# Patient Record
Sex: Male | Born: 2000 | Race: Black or African American | Hispanic: No | Marital: Single | Smoking: Never smoker
Health system: Southern US, Community
[De-identification: ages and names within clinical notes are randomized; demographics above are authoritative.]

---
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
## 2020-08-08 ENCOUNTER — Emergency Department (HOSPITAL_COMMUNITY): Payer: BC Managed Care – PPO

## 2020-08-08 ENCOUNTER — Emergency Department (HOSPITAL_COMMUNITY)
Admission: EM | Admit: 2020-08-08 | Discharge: 2020-08-08 | Disposition: A | Discharge status: Home / Self Care | Payer: BC Managed Care – PPO | Attending: Emergency Medicine | Admitting: Emergency Medicine

## 2020-08-08 ENCOUNTER — Other Ambulatory Visit: Payer: Self-pay

## 2020-08-08 DIAGNOSIS — R079 Chest pain, unspecified: Secondary | ICD-10-CM | POA: Diagnosis present

## 2020-08-08 DIAGNOSIS — R0789 Other chest pain: Secondary | ICD-10-CM

## 2020-08-08 LAB — COMPREHENSIVE METABOLIC PANEL
ALT: 57 U/L — ABNORMAL HIGH (ref 0–44)
AST: 42 U/L — ABNORMAL HIGH (ref 15–41)
Albumin: 4.9 g/dL (ref 3.5–5.0)
Alkaline Phosphatase: 62 U/L (ref 38–126)
Anion gap: 9 (ref 5–15)
BUN: 11 mg/dL (ref 6–20)
CO2: 27 mmol/L (ref 22–32)
Calcium: 9.8 mg/dL (ref 8.9–10.3)
Chloride: 103 mmol/L (ref 98–111)
Creatinine, Ser: 1.13 mg/dL (ref 0.61–1.24)
GFR, Estimated: >60 mL/min (ref >60)
Glucose, Bld: 86 mg/dL (ref 70–99)
Potassium: 3.5 mmol/L (ref 3.5–5.1)
Sodium: 139 mmol/L (ref 135–145)
Total Bilirubin: 1.6 mg/dL — ABNORMAL HIGH (ref 0.3–1.2)
Total Protein: 7.9 g/dL (ref 6.5–8.1)

## 2020-08-08 LAB — CBC WITH DIFFERENTIAL/PLATELET
Abs Immature Granulocytes: 0.01 10*3/uL (ref 0.00–0.07)
Basophils Absolute: 0 10*3/uL (ref 0.0–0.1)
Basophils Relative: 1 %
Eosinophils Absolute: 0 10*3/uL (ref 0.0–0.5)
Eosinophils Relative: 0 %
HCT: 46.7 % (ref 39.0–52.0)
Hemoglobin: 15.7 g/dL (ref 13.0–17.0)
Immature Granulocytes: 0 %
Lymphocytes Relative: 41 %
Lymphs Abs: 2.6 10*3/uL (ref 0.7–4.0)
MCH: 31.2 pg (ref 26.0–34.0)
MCHC: 33.6 g/dL (ref 30.0–36.0)
MCV: 92.7 fL (ref 80.0–100.0)
Monocytes Absolute: 0.6 10*3/uL (ref 0.1–1.0)
Monocytes Relative: 10 %
Neutro Abs: 3 10*3/uL (ref 1.7–7.7)
Neutrophils Relative %: 48 %
Platelets: 330 10*3/uL (ref 150–400)
RBC: 5.04 MIL/uL (ref 4.22–5.81)
RDW: 11.8 % (ref 11.5–15.5)
WBC: 6.2 10*3/uL (ref 4.0–10.5)
nRBC: 0 % (ref 0.0–0.2)

## 2020-08-08 LAB — TROPONIN I (HIGH SENSITIVITY)
Troponin I (High Sensitivity): 2 ng/L (ref <18)
Troponin I (High Sensitivity): 2 ng/L (ref <18)

## 2020-08-08 LAB — LIPASE, BLOOD: Lipase: 22 U/L (ref 11–51)

## 2020-08-08 MED ORDER — LIDOCAINE VISCOUS HCL 2 % MT SOLN
15.0000 mL | Freq: Once | OROMUCOSAL | Status: AC
  Administered 2020-08-08: 15 mL via ORAL
  Filled 2020-08-08: qty 15

## 2020-08-08 MED ORDER — PANTOPRAZOLE SODIUM 40 MG PO TBEC: 40.0000 mg | DELAYED_RELEASE_TABLET | Freq: Every day | ORAL | 3 refills | Status: AC

## 2020-08-08 MED ORDER — ALUM & MAG HYDROXIDE-SIMETH 200-200-20 MG/5ML PO SUSP
30.0000 mL | Freq: Once | ORAL | Status: AC
  Administered 2020-08-08: 30 mL via ORAL
  Filled 2020-08-08: qty 30

## 2020-08-08 NOTE — ED Triage Notes (Signed)
Pt came in with c/o chest pain in the center that radiates up to his throat and occasionally radiates to his back. Pt states it has been on and off since Friday

## 2020-08-09 NOTE — ED Provider Notes (Signed)
[facility]-EMERGENCY DEPT Provider Note   CSN: [id number] Arrival date & time: 08/08/20  0307     History Chief Complaint  Patient presents with  . Heartburn    Terrance Gray is a 20 y.o. male.  Patient presents to the emergency department for evaluation of chest pain.  Patient reports pain in his chest that radiates up into the throat area.  Tubes have been on and off for several days.  No known cardiac risk factors.  No history of cardiac disease.  No family history of sudden cardiac death.        No past medical history on file.  There are no problems to display for this patient.   No past surgical history on file.     No family history on file.  Social History   Tobacco Use  . Smoking status: Never Smoker  Substance Use Topics  . Alcohol use: No  . Drug use: No    Home Medications Prior to Admission medications   Medication Sig Start Date End Date Taking? Authorizing Provider  pantoprazole (PROTONIX) 40 MG tablet Take 1 tablet (40 mg total) by mouth daily. 08/08/20  Yes Elody Kleinsasser, Canary Brim, MD    Allergies    Shellfish allergy  Review of Systems   Review of Systems  Cardiovascular: Positive for chest pain.    Physical Exam Updated Vital Signs BP 117/74 (BP Location: Left Arm)   Pulse (!) 51   Temp 98.4 F (36.9 C) (Oral)   Resp 16   Ht 5\' 8"  (1.727 m)   Wt 77.1 kg   SpO2 99%   BMI 25.85 kg/m   Physical Exam Vitals and nursing note reviewed.  Constitutional:      General: He is not in acute distress.    Appearance: Normal appearance. He is well-developed.  HENT:     Head: Normocephalic and atraumatic.     Right Ear: Hearing normal.     Left Ear: Hearing normal.     Nose: Nose normal.  Eyes:     Conjunctiva/sclera: Conjunctivae normal.     Pupils: Pupils are equal, round, and reactive to light.  Cardiovascular:     Rate and Rhythm: Regular rhythm.     Heart sounds: S1 normal and S2 normal. No murmur heard. No  friction rub. No gallop.   Pulmonary:     Effort: Pulmonary effort is normal. No respiratory distress.     Breath sounds: Normal breath sounds.  Chest:     Chest wall: No tenderness.  Abdominal:     General: Bowel sounds are normal.     Palpations: Abdomen is soft.     Tenderness: There is no abdominal tenderness. There is no guarding or rebound. Negative signs include Murphy's sign and McBurney's sign.     Hernia: No hernia is present.  Musculoskeletal:        General: Normal range of motion.     Cervical back: Normal range of motion and neck supple.  Skin:    General: Skin is warm and dry.     Findings: No rash.  Neurological:     Mental Status: He is alert and oriented to person, place, and time.     GCS: GCS eye subscore is 4. GCS verbal subscore is 5. GCS motor subscore is 6.     Cranial Nerves: No cranial nerve deficit.     Sensory: No sensory deficit.     Coordination: Coordination normal.  Psychiatric:  Speech: Speech normal.        Behavior: Behavior normal.        Thought Content: Thought content normal.     ED Results / Procedures / Treatments   Labs (all labs ordered are listed, but only abnormal results are displayed) Labs Reviewed  COMPREHENSIVE METABOLIC PANEL - Abnormal; Notable for the following components:      Result Value   AST 42 (*)    ALT 57 (*)    Total Bilirubin 1.6 (*)    All other components within normal limits  CBC WITH DIFFERENTIAL/PLATELET  LIPASE, BLOOD  TROPONIN I (HIGH SENSITIVITY)  TROPONIN I (HIGH SENSITIVITY)    EKG None  Radiology DG Chest 2 View  Result Date: 08/08/2020 CLINICAL DATA:  20 year old male with chest pain radiating to the throat and back intermittently for 2 days. EXAM: CHEST - 2 VIEW COMPARISON:  Chest radiographs 05/05/2013. FINDINGS: Normal lung volumes and mediastinal contours. Visualized tracheal air column is within normal limits. Both lungs appear clear. No pneumothorax or pleural effusion. No osseous  abnormality identified. Negative visible bowel gas pattern. IMPRESSION: Negative.  No cardiopulmonary abnormality. Electronically Signed   By: Odessa Fleming M.D.   On: 08/08/2020 05:54    Procedures Procedures   Medications Ordered in ED Medications  alum & mag hydroxide-simeth (MAALOX/MYLANTA) 200-200-20 MG/5ML suspension 30 mL (30 mLs Oral Given 08/08/20 0757)    And  lidocaine (XYLOCAINE) 2 % viscous mouth solution 15 mL (15 mLs Oral Given 08/08/20 0757)    ED Course  I have reviewed the triage vital signs and the nursing notes.  Pertinent labs & imaging results that were available during my care of the patient were reviewed by me and considered in my medical decision making (see chart for details).    MDM Rules/Calculators/A&P                          Well-appearing 20 year old male with no medical history.  Presents with atypical chest pain.  Does not have any cardiac risk factors.  Abdominal exam benign, nontender.  No concern for acute surgical process in the abdomen.  Patient without any shortness of breath.  Vital signs normal, no tachypnea, tachycardia or hypoxia.  PERC negative.  Cardiac evaluation reassuring. Final Clinical Impression(s) / ED Diagnoses Final diagnoses:  Non-cardiac chest pain    Rx / DC Orders ED Discharge Orders         Ordered    pantoprazole (PROTONIX) 40 MG tablet  Daily        08/08/20 0725           Gilda Crease, MD 08/09/20 [phone]

## 2022-03-24 IMAGING — CR DG CHEST 2V
2 series · 2 of 2 positions shown · non-contrast
Comparison: Chest radiographs 05/05/2013.

CLINICAL DATA: 20-year-old male with chest pain radiating to the
throat and back intermittently for 2 days.

EXAM:
CHEST - 2 VIEW

[w chest pa]
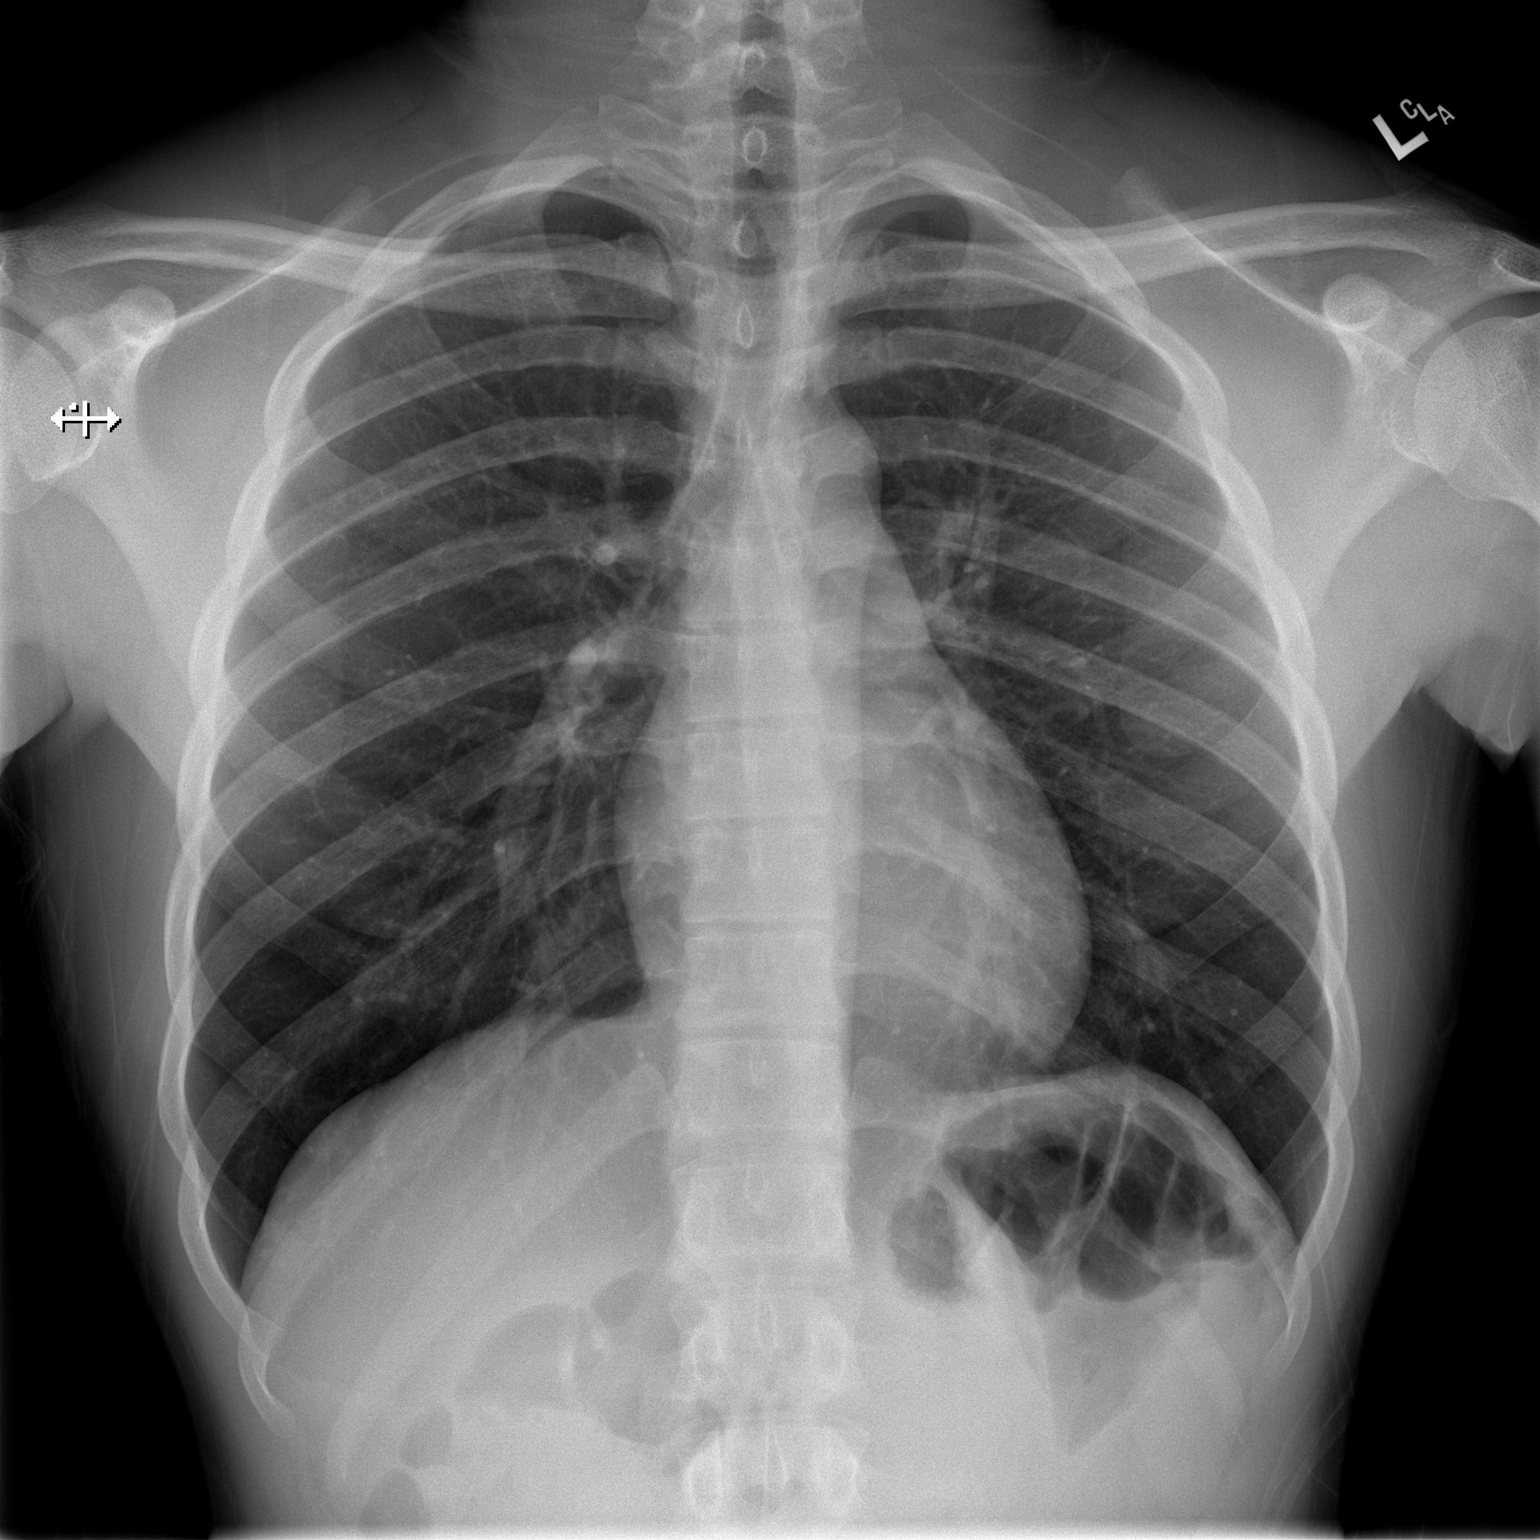

[w chest lat]
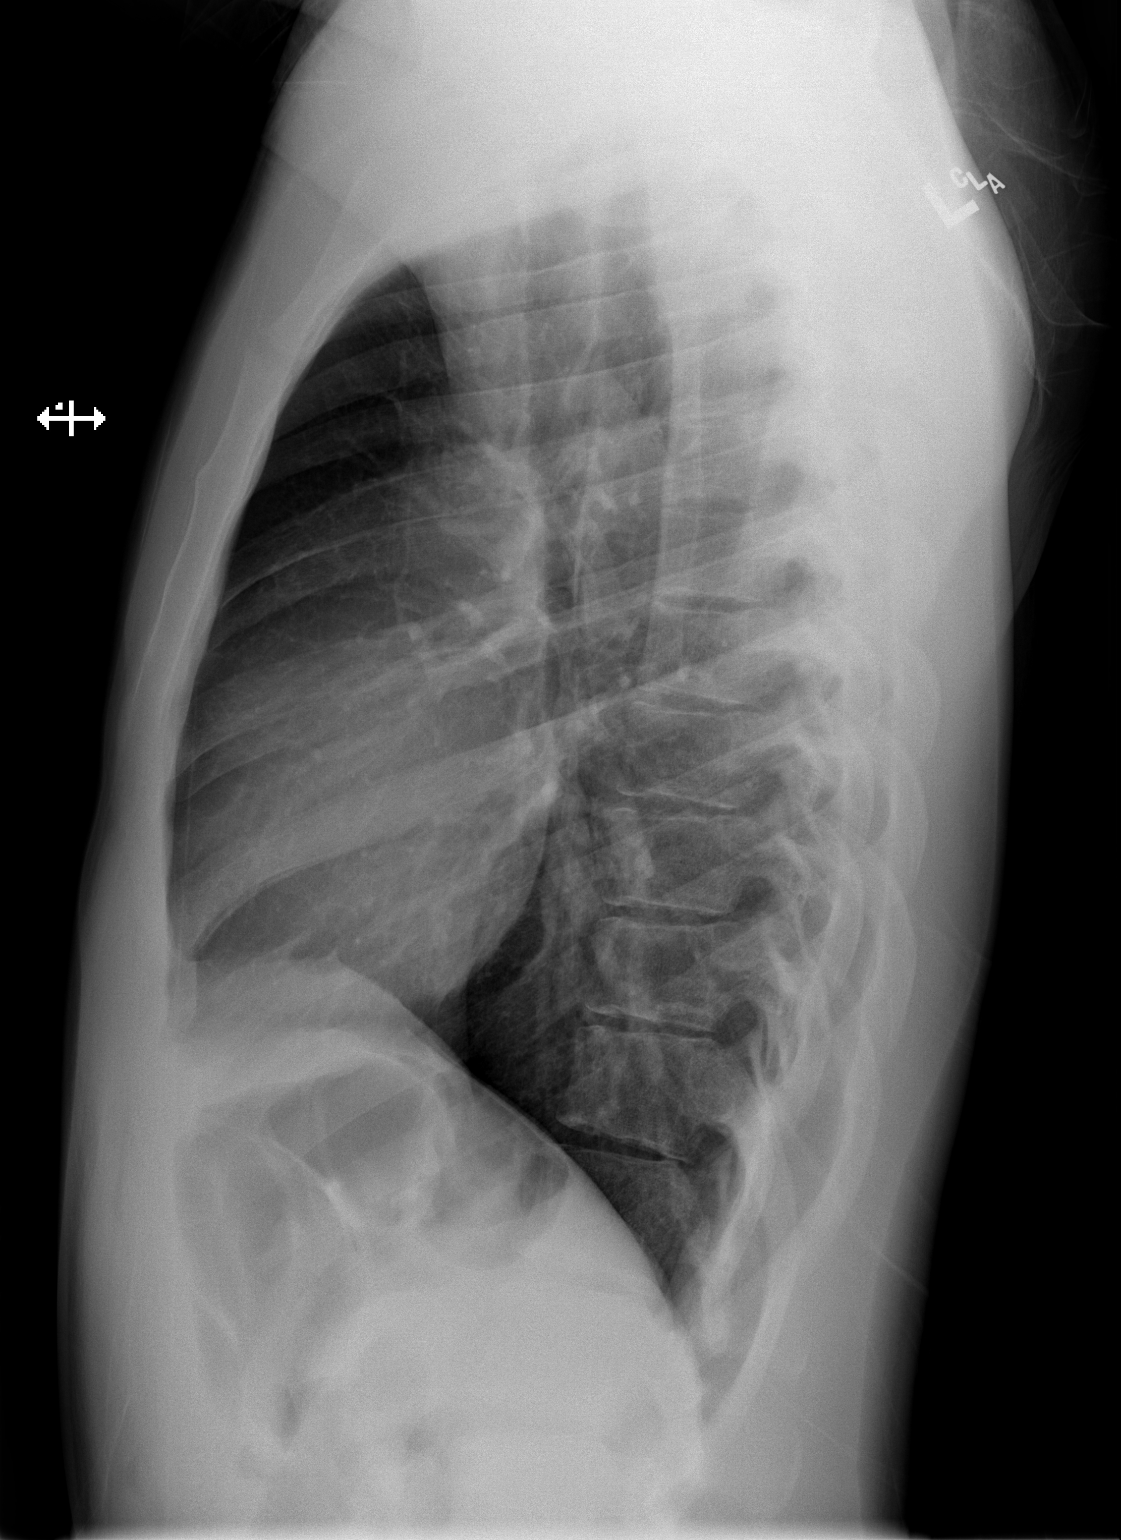

[2 of 2 positions shown; findings below may reference images not displayed]

FINDINGS: Normal lung volumes and mediastinal contours. Visualized tracheal
air column is within normal limits. Both lungs appear clear. No
pneumothorax or pleural effusion. No osseous abnormality identified.
Negative visible bowel gas pattern.
IMPRESSION: Negative.  No cardiopulmonary abnormality.
